# Patient Record
Sex: Male | Born: 1975 | Hispanic: Yes | Marital: Single
Health system: Southern US, Community
[De-identification: ages and names within clinical notes are randomized; demographics above are authoritative.]

---

## 2013-07-07 ENCOUNTER — Emergency Department: Payer: Self-pay | Admitting: Emergency Medicine

## 2013-09-23 ENCOUNTER — Emergency Department: Payer: Self-pay | Admitting: Emergency Medicine

## 2013-09-23 LAB — COMPREHENSIVE METABOLIC PANEL
ALBUMIN: 4.1 g/dL (ref 3.4–5.0)
ALT: 51 U/L (ref 12–78)
ANION GAP: 9 (ref 7–16)
Alkaline Phosphatase: 102 U/L
BUN: 16 mg/dL (ref 7–18)
Bilirubin,Total: 0.4 mg/dL (ref 0.2–1.0)
Calcium, Total: 8.5 mg/dL (ref 8.5–10.1)
Chloride: 104 mmol/L (ref 98–107)
Co2: 26 mmol/L (ref 21–32)
Creatinine: 1.04 mg/dL (ref 0.60–1.30)
EGFR (African American): >60
EGFR (Non-African Amer.): >60
Glucose: 124 mg/dL — ABNORMAL HIGH (ref 65–99)
Osmolality: 280 (ref 275–301)
Potassium: 3.5 mmol/L (ref 3.5–5.1)
SGOT(AST): 43 U/L — ABNORMAL HIGH (ref 15–37)
Sodium: 139 mmol/L (ref 136–145)
Total Protein: 8 g/dL (ref 6.4–8.2)

## 2013-09-23 LAB — CBC
HCT: 45.4 % (ref 40.0–52.0)
HGB: 15.4 g/dL (ref 13.0–18.0)
MCH: 31.3 pg (ref 26.0–34.0)
MCHC: 33.8 g/dL (ref 32.0–36.0)
MCV: 93 fL (ref 80–100)
PLATELETS: 228 10*3/uL (ref 150–440)
RBC: 4.9 10*6/uL (ref 4.40–5.90)
RDW: 14.4 % (ref 11.5–14.5)
WBC: 6.4 10*3/uL (ref 3.8–10.6)

## 2013-09-23 LAB — URINALYSIS, COMPLETE
BACTERIA: NONE SEEN
BLOOD: NEGATIVE
Bilirubin,UR: NEGATIVE
Glucose,UR: NEGATIVE mg/dL (ref 0–75)
Ketone: NEGATIVE
Leukocyte Esterase: NEGATIVE
Nitrite: NEGATIVE
PH: 6 (ref 4.5–8.0)
PROTEIN: NEGATIVE
RBC,UR: NONE SEEN /HPF (ref 0–5)
SPECIFIC GRAVITY: 1.017 (ref 1.003–1.030)
SQUAMOUS EPITHELIAL: NONE SEEN
WBC UR: 1 /HPF (ref 0–5)

## 2013-09-23 LAB — TROPONIN I: Troponin-I: <0.02 ng/mL

## 2013-09-28 ENCOUNTER — Emergency Department: Payer: Self-pay | Admitting: Emergency Medicine

## 2013-09-28 LAB — CBC
HCT: 46.5 % (ref 40.0–52.0)
HGB: 15.8 g/dL (ref 13.0–18.0)
MCH: 31.4 pg (ref 26.0–34.0)
MCHC: 34 g/dL (ref 32.0–36.0)
MCV: 92 fL (ref 80–100)
Platelet: 236 10*3/uL (ref 150–440)
RBC: 5.05 10*6/uL (ref 4.40–5.90)
RDW: 14 % (ref 11.5–14.5)
WBC: 7.1 10*3/uL (ref 3.8–10.6)

## 2013-09-28 LAB — COMPREHENSIVE METABOLIC PANEL
ALBUMIN: 4.2 g/dL (ref 3.4–5.0)
ALK PHOS: 97 U/L
ALT: 38 U/L (ref 12–78)
Anion Gap: 5 — ABNORMAL LOW (ref 7–16)
BUN: 19 mg/dL — AB (ref 7–18)
Bilirubin,Total: 0.6 mg/dL (ref 0.2–1.0)
CHLORIDE: 105 mmol/L (ref 98–107)
CREATININE: 1.44 mg/dL — AB (ref 0.60–1.30)
Calcium, Total: 8.8 mg/dL (ref 8.5–10.1)
Co2: 29 mmol/L (ref 21–32)
EGFR (African American): >60
EGFR (Non-African Amer.): >60
GLUCOSE: 84 mg/dL (ref 65–99)
Osmolality: 279 (ref 275–301)
POTASSIUM: 3.7 mmol/L (ref 3.5–5.1)
SGOT(AST): 48 U/L — ABNORMAL HIGH (ref 15–37)
SODIUM: 139 mmol/L (ref 136–145)
Total Protein: 7.8 g/dL (ref 6.4–8.2)

## 2013-09-28 LAB — PRO B NATRIURETIC PEPTIDE: B-TYPE NATIURETIC PEPTID: 13 pg/mL (ref 0–125)

## 2013-09-28 LAB — TROPONIN I: Troponin-I: <0.02 ng/mL

## 2013-10-01 LAB — BETA STREP CULTURE(ARMC)

## 2013-10-11 ENCOUNTER — Emergency Department: Payer: Self-pay | Admitting: Internal Medicine

## 2013-10-11 LAB — BASIC METABOLIC PANEL
Anion Gap: 3 — ABNORMAL LOW (ref 7–16)
BUN: 27 mg/dL — ABNORMAL HIGH (ref 7–18)
CHLORIDE: 105 mmol/L (ref 98–107)
CO2: 29 mmol/L (ref 21–32)
Calcium, Total: 8.6 mg/dL (ref 8.5–10.1)
Creatinine: 1.19 mg/dL (ref 0.60–1.30)
EGFR (African American): >60
EGFR (Non-African Amer.): >60
Glucose: 102 mg/dL — ABNORMAL HIGH (ref 65–99)
OSMOLALITY: 279 (ref 275–301)
Potassium: 3.3 mmol/L — ABNORMAL LOW (ref 3.5–5.1)
SODIUM: 137 mmol/L (ref 136–145)

## 2013-10-11 LAB — ACETAMINOPHEN LEVEL

## 2013-10-11 LAB — CBC
HCT: 44.5 % (ref 40.0–52.0)
HGB: 15.1 g/dL (ref 13.0–18.0)
MCH: 32 pg (ref 26.0–34.0)
MCHC: 34 g/dL (ref 32.0–36.0)
MCV: 94 fL (ref 80–100)
PLATELETS: 230 10*3/uL (ref 150–440)
RBC: 4.72 10*6/uL (ref 4.40–5.90)
RDW: 14.2 % (ref 11.5–14.5)
WBC: 10.9 10*3/uL — AB (ref 3.8–10.6)

## 2013-10-11 LAB — TROPONIN I

## 2013-10-11 LAB — DRUG SCREEN, URINE

## 2013-10-11 LAB — SALICYLATE LEVEL: Salicylates, Serum: <1.7 mg/dL

## 2013-10-11 LAB — ETHANOL
Ethanol %: <0.003 % (ref 0.000–0.080)
Ethanol: <3 mg/dL

## 2013-11-06 ENCOUNTER — Emergency Department: Payer: Self-pay | Admitting: Emergency Medicine

## 2013-11-06 LAB — CBC
HCT: 43.3 % (ref 40.0–52.0)
HGB: 14.7 g/dL (ref 13.0–18.0)
MCH: 31.9 pg (ref 26.0–34.0)
MCHC: 34.1 g/dL (ref 32.0–36.0)
MCV: 94 fL (ref 80–100)
Platelet: 258 10*3/uL (ref 150–440)
RBC: 4.62 10*6/uL (ref 4.40–5.90)
RDW: 14.6 % — ABNORMAL HIGH (ref 11.5–14.5)
WBC: 6.3 10*3/uL (ref 3.8–10.6)

## 2013-11-06 LAB — COMPREHENSIVE METABOLIC PANEL
ALBUMIN: 4.1 g/dL (ref 3.4–5.0)
ANION GAP: 9 (ref 7–16)
Alkaline Phosphatase: 80 U/L
BILIRUBIN TOTAL: 0.9 mg/dL (ref 0.2–1.0)
BUN: 27 mg/dL — ABNORMAL HIGH (ref 7–18)
CALCIUM: 8.8 mg/dL (ref 8.5–10.1)
CREATININE: 0.97 mg/dL (ref 0.60–1.30)
Chloride: 105 mmol/L (ref 98–107)
Co2: 26 mmol/L (ref 21–32)
Glucose: 108 mg/dL — ABNORMAL HIGH (ref 65–99)
Osmolality: 285 (ref 275–301)
Potassium: 3.6 mmol/L (ref 3.5–5.1)
SGOT(AST): 29 U/L (ref 15–37)
SGPT (ALT): 27 U/L (ref 12–78)
SODIUM: 140 mmol/L (ref 136–145)
Total Protein: 7.4 g/dL (ref 6.4–8.2)

## 2013-11-06 LAB — TROPONIN I: Troponin-I: <0.02 ng/mL

## 2013-11-06 LAB — LIPASE, BLOOD: Lipase: 172 U/L (ref 73–393)

## 2013-11-06 LAB — MAGNESIUM: MAGNESIUM: 2.2 mg/dL

## 2013-12-06 ENCOUNTER — Ambulatory Visit: Payer: Self-pay | Admitting: Gastroenterology

## 2013-12-14 ENCOUNTER — Emergency Department: Payer: Self-pay | Admitting: Emergency Medicine

## 2013-12-14 LAB — CBC
HCT: 44.6 % (ref 40.0–52.0)
HGB: 15.1 g/dL (ref 13.0–18.0)
MCH: 31.1 pg (ref 26.0–34.0)
MCHC: 33.8 g/dL (ref 32.0–36.0)
MCV: 92 fL (ref 80–100)
PLATELETS: 242 10*3/uL (ref 150–440)
RBC: 4.84 10*6/uL (ref 4.40–5.90)
RDW: 13.2 % (ref 11.5–14.5)
WBC: 6.3 10*3/uL (ref 3.8–10.6)

## 2013-12-14 LAB — BASIC METABOLIC PANEL
Anion Gap: 11 (ref 7–16)
BUN: 18 mg/dL (ref 7–18)
CHLORIDE: 106 mmol/L (ref 98–107)
CREATININE: 1 mg/dL (ref 0.60–1.30)
Calcium, Total: 8.6 mg/dL (ref 8.5–10.1)
Co2: 25 mmol/L (ref 21–32)
EGFR (Non-African Amer.): >60
GLUCOSE: 119 mg/dL — AB (ref 65–99)
Osmolality: 286 (ref 275–301)
Potassium: 3.1 mmol/L — ABNORMAL LOW (ref 3.5–5.1)
SODIUM: 142 mmol/L (ref 136–145)

## 2013-12-14 LAB — TROPONIN I: Troponin-I: <0.02 ng/mL

## 2013-12-17 ENCOUNTER — Emergency Department: Payer: Self-pay | Admitting: Emergency Medicine

## 2013-12-17 LAB — COMPREHENSIVE METABOLIC PANEL
ALBUMIN: 4 g/dL (ref 3.4–5.0)
ALT: 33 U/L
AST: 30 U/L (ref 15–37)
Alkaline Phosphatase: 89 U/L
Anion Gap: 8 (ref 7–16)
BILIRUBIN TOTAL: 1 mg/dL (ref 0.2–1.0)
BUN: 15 mg/dL (ref 7–18)
CALCIUM: 8.7 mg/dL (ref 8.5–10.1)
Chloride: 104 mmol/L (ref 98–107)
Co2: 29 mmol/L (ref 21–32)
Creatinine: 1.16 mg/dL (ref 0.60–1.30)
EGFR (African American): >60
Glucose: 114 mg/dL — ABNORMAL HIGH (ref 65–99)
Osmolality: 283 (ref 275–301)
Potassium: 3.3 mmol/L — ABNORMAL LOW (ref 3.5–5.1)
Sodium: 141 mmol/L (ref 136–145)
Total Protein: 7.6 g/dL (ref 6.4–8.2)

## 2013-12-17 LAB — DRUG SCREEN, URINE

## 2013-12-17 LAB — CBC
HCT: 45.1 % (ref 40.0–52.0)
HGB: 15.1 g/dL (ref 13.0–18.0)
MCH: 31.3 pg (ref 26.0–34.0)
MCHC: 33.6 g/dL (ref 32.0–36.0)
MCV: 93 fL (ref 80–100)
PLATELETS: 230 10*3/uL (ref 150–440)
RBC: 4.84 10*6/uL (ref 4.40–5.90)
RDW: 12.9 % (ref 11.5–14.5)
WBC: 5.6 10*3/uL (ref 3.8–10.6)

## 2013-12-17 LAB — URINALYSIS, COMPLETE
BLOOD: NEGATIVE
Bacteria: NONE SEEN
Bilirubin,UR: NEGATIVE
Glucose,UR: NEGATIVE mg/dL (ref 0–75)
KETONE: NEGATIVE
LEUKOCYTE ESTERASE: NEGATIVE
Nitrite: NEGATIVE
PH: 5 (ref 4.5–8.0)
Protein: NEGATIVE
RBC,UR: 1 /HPF (ref 0–5)
SPECIFIC GRAVITY: 1.005 (ref 1.003–1.030)
Squamous Epithelial: NONE SEEN
WBC UR: 1 /HPF (ref 0–5)

## 2013-12-17 LAB — TSH: THYROID STIMULATING HORM: 26.1 u[IU]/mL — AB

## 2013-12-17 LAB — SALICYLATE LEVEL: Salicylates, Serum: <1.7 mg/dL

## 2013-12-17 LAB — ETHANOL
Ethanol %: <0.003 % (ref 0.000–0.080)
Ethanol: <3 mg/dL

## 2013-12-17 LAB — ACETAMINOPHEN LEVEL: Acetaminophen: <2 ug/mL

## 2013-12-21 ENCOUNTER — Ambulatory Visit: Payer: Self-pay | Admitting: Cardiology

## 2014-09-07 NOTE — Consult Note (Signed)
PATIENT NAME:  Brandon Long, Brandon Long MR#:  [id number] DATE OF BIRTH:  28-Mar-1976  DATE OF CONSULTATION:  12/17/2013  CONSULTING PHYSICIAN:  Audery AmelJohn T. Clapacs, MD  IDENTIFYING INFORMATION AND REASON FOR CONSULTATION: A 39 year old man with a history of medical problems and a past history of some depressive symptoms comes to the Emergency Room complaining of recent onset of symptoms of depression. Consultation for appropriate disposition.   HISTORY OF PRESENT ILLNESS: Information obtained from the patient and the chart. The patient was interviewed with the help of a Spanish language interpreter from the hospital. He reports that he is chiefly concerned about the pain in his back which he says is causing him to have thoughts about killing himself. He does not actually think he is going to act on it, but says that he cannot get the thought out of his mind. He says he has pain in his right shoulder and in his lower back. It has been present for a couple of weeks. There is no clear new injury or cause for it. He also talks about having problems with his thyroid. He has been diagnosed as having hypothyroidism and is supposed to be taking thyroid supplement. He has been taking it now for a little over a week but knows it will take longer for it to readjust. He does report depressed mood, and low energy. Sleep is poor. Appetite is poor. Does not report any hallucinations or psychotic symptoms, is not currently receiving psychiatric treatment. The patient states that he does not think he is really going to act on trying to hurt himself and feels confident that if he goes home tonight he is not at risk of suicide. He is not currently abusing alcohol or drugs.   PAST PSYCHIATRIC HISTORY: He was seen in the Emergency Room here in May with a similar situation that seemed to have been provoked by treatment with prednisone. It is unclear whether his current symptoms could have been triggered by his treatment with his  thyroid hormone. He does not have a history of actually trying to kill himself in the past. No history of psychiatric hospitalization or medication.   FAMILY HISTORY: Says he has a sister and a mother both with hypothyroidism.   SOCIAL HISTORY: Lives by himself. Was last working about a couple of weeks ago, but had to stop because he was feeling more sick and dizzy. Does have family in the area that he stays in contact with.   PAST MEDICAL HISTORY: New onset back pain and recently diagnosed hypothyroidism.   MEDICATIONS: Thyroid hormone unclear dosage. Not known if he has any other medication.   ALLERGIES: PREDNISONE.   REVIEW OF SYSTEMS: Feeling somewhat depressed, having some suicidal thoughts without any intent to act on it. Denies hallucinations or delusions. Complains of pain in his shoulder and his lower back. Feeling fatigued, poor sleep, poor appetite.   MENTAL STATUS EXAMINATION: A slightly disheveled gentleman who looks older than his stated age. Cooperative with the interview. Eye contact normal. Psychomotor activity subdued. Speech understandable, somewhat decreased in tone. Affect blunted. Mood stated as being bad. Thoughts are apparently lucid with no evidence of loosening of associations or delusions. Denies hallucinations. Denies any homicidal ideation, has positive suicidal thoughts with no intent to act on it. Alert and oriented. Judgment and insight adequate.   VITAL SIGNS: Temperature 98.3, pulse 57, respirations 18, blood pressure 113/70.   LABORATORY RESULTS: His TSH is 26. Alcohol level negative. Chemistry panel: Elevated glucose 114, low potassium  at 3.3. Drug screen all negative. CBC unremarkable. Urinalysis unremarkable.   ASSESSMENT: A 39 year old man with a history of hypothyroidism. Also a past history of mood symptoms that seem to have been triggered by medication. Currently having symptoms of depression with some suicidal ideation, but has no plan or intent and he  has maintained good insight and judgment. The patient feels there is no risk that he is going to harm himself tonight. He is perfectly agreeable to following up with his primary care doctor and continuing his thyroid medicine. He has been educated to immediately contact someone or come back to the hospital if suicidal ideation were to worsen. He is agreeable to the plan. Given the situation, we will release him from [facility] and plan for followup with his primary care doctor.   DIAGNOSIS, PRINCIPAL AND PRIMARY:   AXIS I: Depression, not otherwise specified.   SECONDARY DIAGNOSES:  AXIS I: No further.   AXIS II: Deferred.   AXIS III: Back pain, unclear etiology, severe, hypothyroidism.   AXIS IV: Moderate from his illness and being out of work.   AXIS V: Functioning at time of evaluation 50.    ____________________________ Audery Amel, MD jtc:lt D: 12/17/2013 17:39:51 ET T: 12/17/2013 19:43:09 ET JOB#: [id number]  cc: Audery Amel, MD, <Dictator> Audery Amel MD ELECTRONICALLY SIGNED 12/26/2013 0:40

## 2014-09-07 NOTE — Consult Note (Signed)
PATIENT NAME:  Brandon Long, Brandon Long MR#:  [id number] DATE OF BIRTH:  02/02/1976  DATE OF CONSULTATION:  10/11/2013  REFERRING PHYSICIAN:   CONSULTING PHYSICIAN:  Shaylinn Hladik S. Giani Betzold, MD  REASON FOR CONSULTATION: I was feeling dizzy and could not feel my arms.  HISTORY OF PRESENT ILLNESS: The patient is a 39 year old Hispanic male who was evaluated with the help of the interpreter as he could not speak [place]. The patient reported that he came to the ER because he has been feeling dizzy and was having chest pain and shaking in his arms and legs. He reported that he has been having thoughts to hurt himself as well. He reported that the legs are not supporting him. He reported that he started the medication last week as he was having problems breathing and the medication was given by his lung doctor. He takes 2 pills in the morning and then 5 pills during the daytime. He stated that he started having weakness throughout since he has been on that medication. He was not advised to the taper dose of the medication. He reported that he started having depressive thoughts including hopeless, helpless and having thoughts to kill himself. He admits to having suicidal ideation with a plan to cut himself with a knife or something faster. He currently denied having any homicidal ideations. He stated that he currently lives by himself and works on Clinical biochemist of cars. He does not have any previous psychiatric history.   PAST PSYCHIATRIC HISTORY: The patient reported that he has never taken any psychotropic medication in the past. He is unable to contract for safety at this time.   FAMILY HISTORY: The patient reported that his sister has a history of mental illness and she takes medication in [place]. He does not know the names of the medication. She was never admitted to a psychiatric hospital.   SUBSTANCE ABUSE HISTORY: The patient reported that he does not drink or use drugs or alcohol. He does not smoke cigarettes.    SUBSTANCE ABUSE HISTORY: As above.   PAST MEDICAL HISTORY: The patient reported that he has history of asthma and was recently started on an inhaler as well as 2 other medications including Augmentin and prednisone by a doctor because he was unable to breathe. He reported that he has been compliant with his medications.   ALLERGIES: No known drug allergies.   CURRENT MEDICATIONS: Augmentin, prednisone 10 mg 5 pills daily, inhaler on a daily basis.   SOCIAL HISTORY: The patient reported that he is currently single and lives by himself. He works in an Associate Professor for [employer]. He reported that he does not have any pending legal charges. His family lives in [place].  REVIEW OF SYSTEMS:  CONSTITUTIONAL: Denies any fever or chills. No weight changes.  EYES: No double or blurred vision.  RESPIRATORY: Having some shortness of breath.  CARDIOVASCULAR: Denies any chest pain or orthopnea.  GASTROINTESTINAL: No abdominal pain, nausea, vomiting, diarrhea.  GENITOURINARY: No incontinence or frequency.  ENDOCRINE: No heat or cold intolerance.  LYMPHATIC: No anemia or easy bruising.  INTEGUMENTARY: No acne or rash.  MUSCULOSKELETAL: Having muscle pain AND weakness in his arms and legs.  NEUROLOGIC: No tingling or weakness.   VITAL SIGNS: Temperature 98.9, pulse 76, respiration 18, blood pressure 131/79.  LABORATORY DATA: Glucose 102, BUN 27, creatinine 1.19, sodium 137, potassium 3.3, chloride 105, bicarbonate 29, anion gap 3, osmolality 279. Blood alcohol level less than 3. UDS is negative. WBC 10.9, hemoglobin 15.1, hematocrit  44.5, platelet count 230,000, MCV 94, RDW 14.2.   MENTAL STATUS EXAMINATION: The patient is a moderately built male who was lying in the bed. He appeared somewhat in distress. Muscle tone appears normal. Gait and station was within normal limits. Speech was low in tone and volume. Thought process was logical, goal-directed. Thought content was nondelusional. No loose  associations are noted. His insight and judgment were within normal limits. He was awake, alert and oriented x3. His recent and remote memory were intact. Attention span and concentration were normal. Mood was depressed and affect was congruent. He admits to having suicidal ideation and wants to cut himself with a knife or something sharper. Unable to contract for safety at this time.   DIAGNOSTIC IMPRESSION: AXIS I: Major depressive disorder, recurrent, moderate.  AXIS II: None.  AXIS III: Asthma.   TREATMENT PLAN: 1.  The patient will be placed on involuntary commitment for safety and stabilization.  2.  He will continue on prednisone as he might be having an adverse reaction to the same. He will be monitored closely by the staff. No new medication will be started at this time.   Thank you for allowing me to participate in the care of this patient.   ____________________________ Ardeen FillersUzma S. Garnetta BuddyFaheem, MD usf:sb D: 10/11/2013 14:08:18 ET T: 10/11/2013 15:56:11 ET JOB#: [id number]  cc: Ardeen FillersUzma S. Garnetta BuddyFaheem, MD, <Dictator> Rhunette CroftUZMA S Alicja Everitt MD ELECTRONICALLY SIGNED 10/16/2013 16:00

## 2014-09-07 NOTE — Consult Note (Signed)
PATIENT NAME:  Brandon Long, Brandon Long MR#:  [id number] DATE OF BIRTH:  03/16/1976  DATE OF CONSULTATION:  10/12/2013  REFERRING PHYSICIAN:   CONSULTING PHYSICIAN:  Audery Amel, MD  IDENTIFYING INFORMATION AND REASON FOR CONSULTATION:  A 39 year old gentleman who came to the Emergency Room seeking help for some medical complaints and then revealed that he had been feeling depressed and having suicidal thoughts. The patient states that he had started having symptoms of depression about 3 days ago and it was an acute onset. He started to feel very negative about his life and feels like everything was terrible. He started to have suicidal thoughts. The thought had occurred to him that he would cut himself. He did not act on it. The patient had just within the last day or so started taking prednisone, which had been given to him apparently for an upper respiratory infection. The patient was sleeping poorly, feeling jittery, as well as depressed. He did not report any psychotic symptoms. The patient denies alcohol or drug abuse. He denies that he had had symptoms of major depression prior to that. Says that he would occasionally feel sad about some of the stresses in his life but generally felt pretty good.   PAST PSYCHIATRIC HISTORY: Denies any history of mental health problems. No history of depression. No hospitalization. No suicide attempts.   SUBSTANCE ABUSE HISTORY: Denies abuse of alcohol or drugs now or in the past.   SOCIAL HISTORY: The patient is a laborer. He has been living here in the [place] area for several years. He says that he lives alone, but has multiple family members who live nearby in the area.   PAST MEDICAL HISTORY: Had an upper respiratory infection recently, but denies that he has any kind of ongoing medical problems.   FAMILY HISTORY: Denies any family history of mental illness.   CURRENT MEDICATIONS: Steroids were stopped here in the Emergency Room. He has not been  given any other medication.   MENTAL STATUS EXAMINATION: Adequately groomed gentleman who looks his stated age, cooperative with the interview. The entire interview was conducted with a hospital Spanish language interpreter. Eye contact was good. Psychomotor activity normal. Speech was normal in rate, tone and volume. Affect was euthymic and appropriate. Mood was stated as being okay. Thoughts were lucid. No loosening of associations. No evidence of delusions. Denied hallucinations. Denied any suicidal or homicidal ideation. Stated that he felt pretty good about his life. Alert and oriented x 4. Judgment and insight improved. Basic fund of knowledge intact.   REVIEW OF SYSTEMS: Currently has no complaints. The sore throat and upper respiratory infection are better. He is not having any shortness of breath. Denies suicidal or homicidal ideation. Denies any psychotic symptoms.   LABORATORY RESULTS: Drug screen was all negative. EKG unremarkable. Acetaminophen and Tylenol and salicylates negative. Alcohol negative. Some minor abnormalities. Nonspecific on the chemistry panel.   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 Now resolved. Currently, no indication of acute dangerousness to himself. Also has no indication of any ongoing mental health problems. The patient no longer meets commitment criteria.   TREATMENT PLAN: The patient was educated about his reaction to steroids and that if in the future he is ever prescribed them, he should tell treatment staff about this episode. No indication to start any psychiatric medicine. He will be given information about local mental health agencies in case symptoms should return and he needed any further assistance.  DIAGNOSIS, PRINCIPAL AND PRIMARY:  AXIS I:  Substance-induced mood disorder, depressed, now resolved.   SECONDARY DIAGNOSES: AXIS I:    No diagnosis.  AXIS II:   No diagnosis.   AXIS III:  Upper respiratory infection, resolving.  AXIS IV:  Moderate from [place].  AXIS V:   Functioning at time of evaluation is 60.   ____________________________ Audery AmelJohn T. Clapacs, MD jtc:ce D: 10/12/2013 18:43:01 ET T: 10/12/2013 19:43:15 ET JOB#: [id number]  cc: Audery AmelJohn T. Clapacs, MD, <Dictator> Audery AmelJOHN T CLAPACS MD ELECTRONICALLY SIGNED 10/31/2013 13:18

## 2015-01-28 IMAGING — CR DG CHEST 2V
1 series · 2 of 2 positions shown · non-contrast
Comparison: 09/28/2013

CLINICAL DATA: Chest pressure, shortness of breath

EXAM:
CHEST  2 VIEW

[Series 1: w chest pa · 0.14mm/px · 2 of 2 slices shown]
[im 1/2]
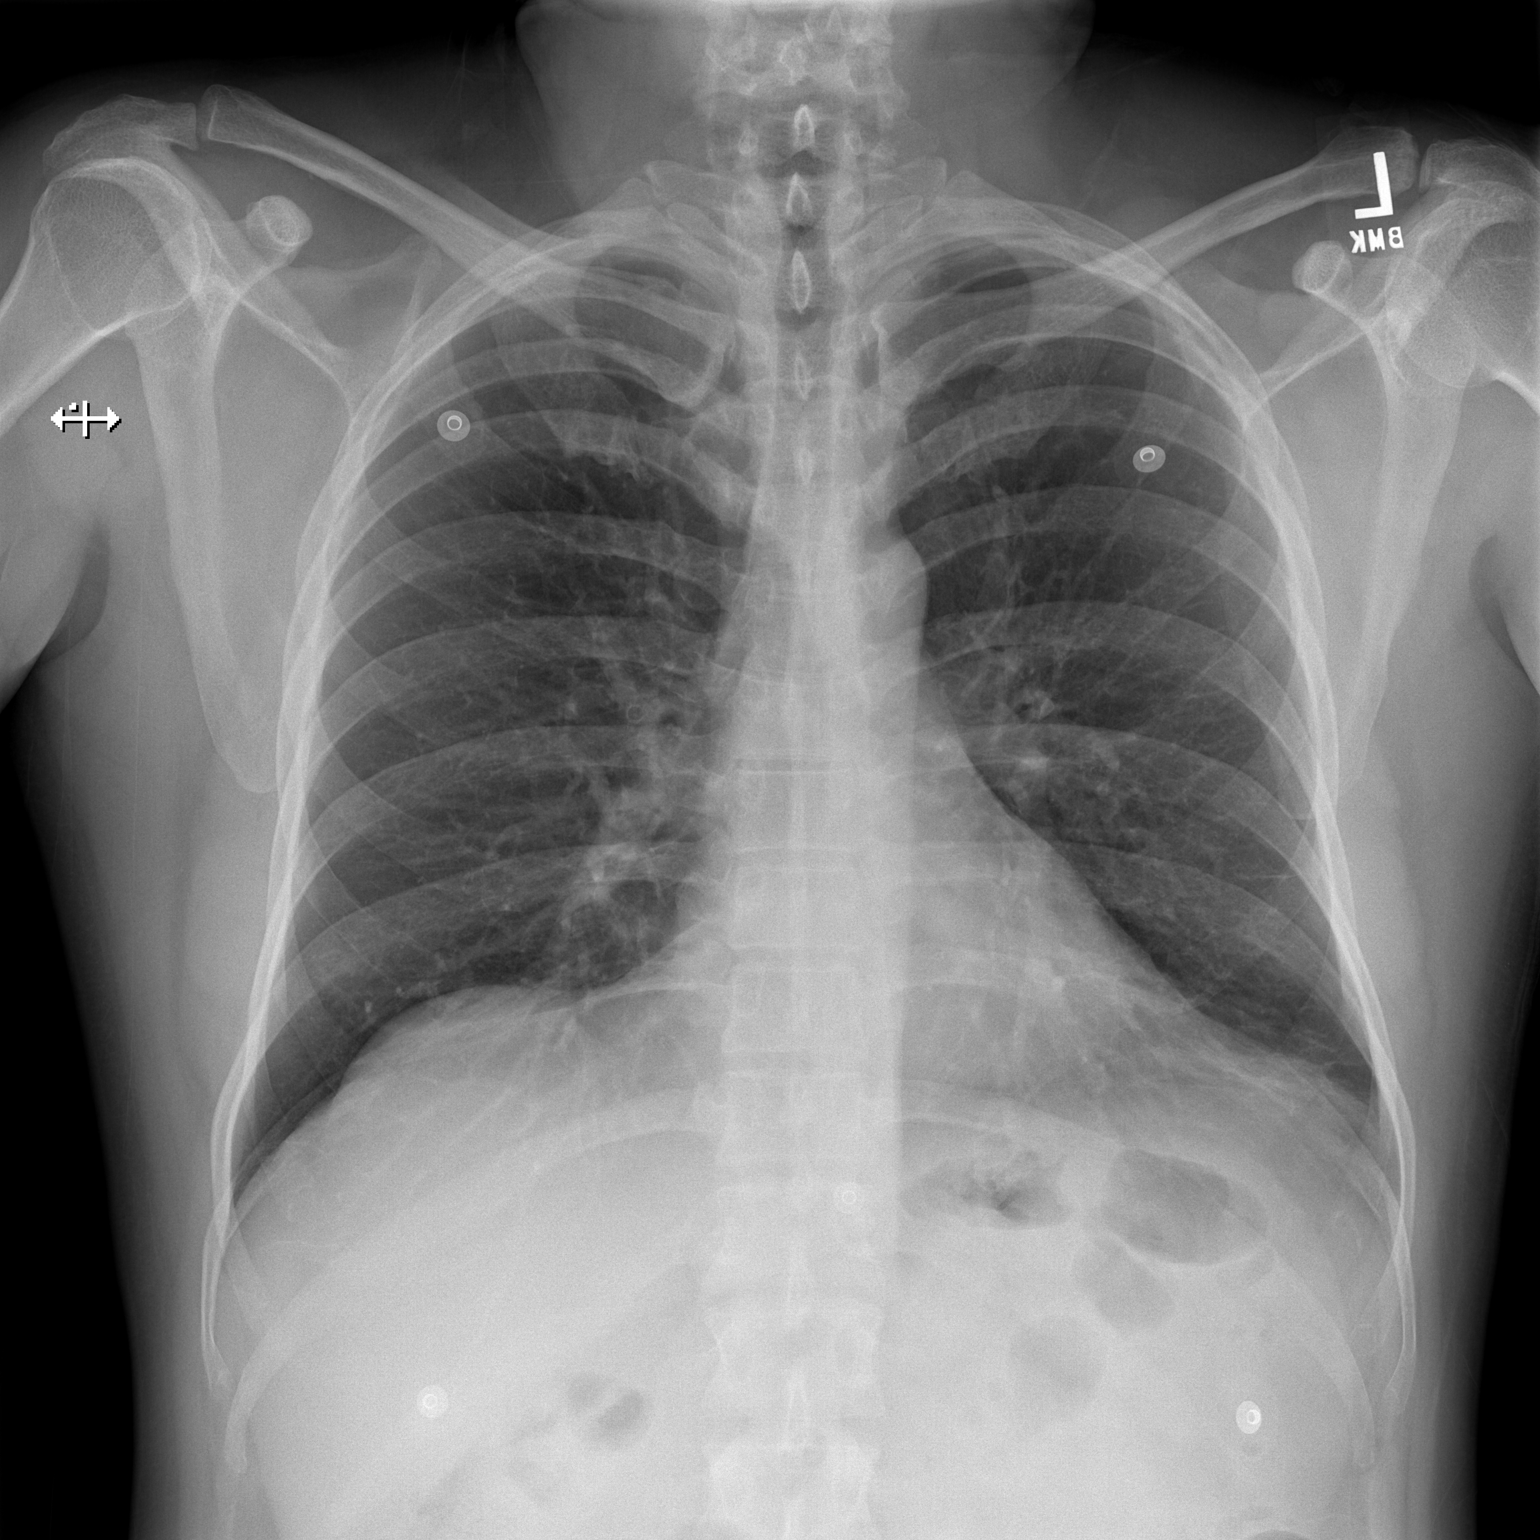
[im 2/2]
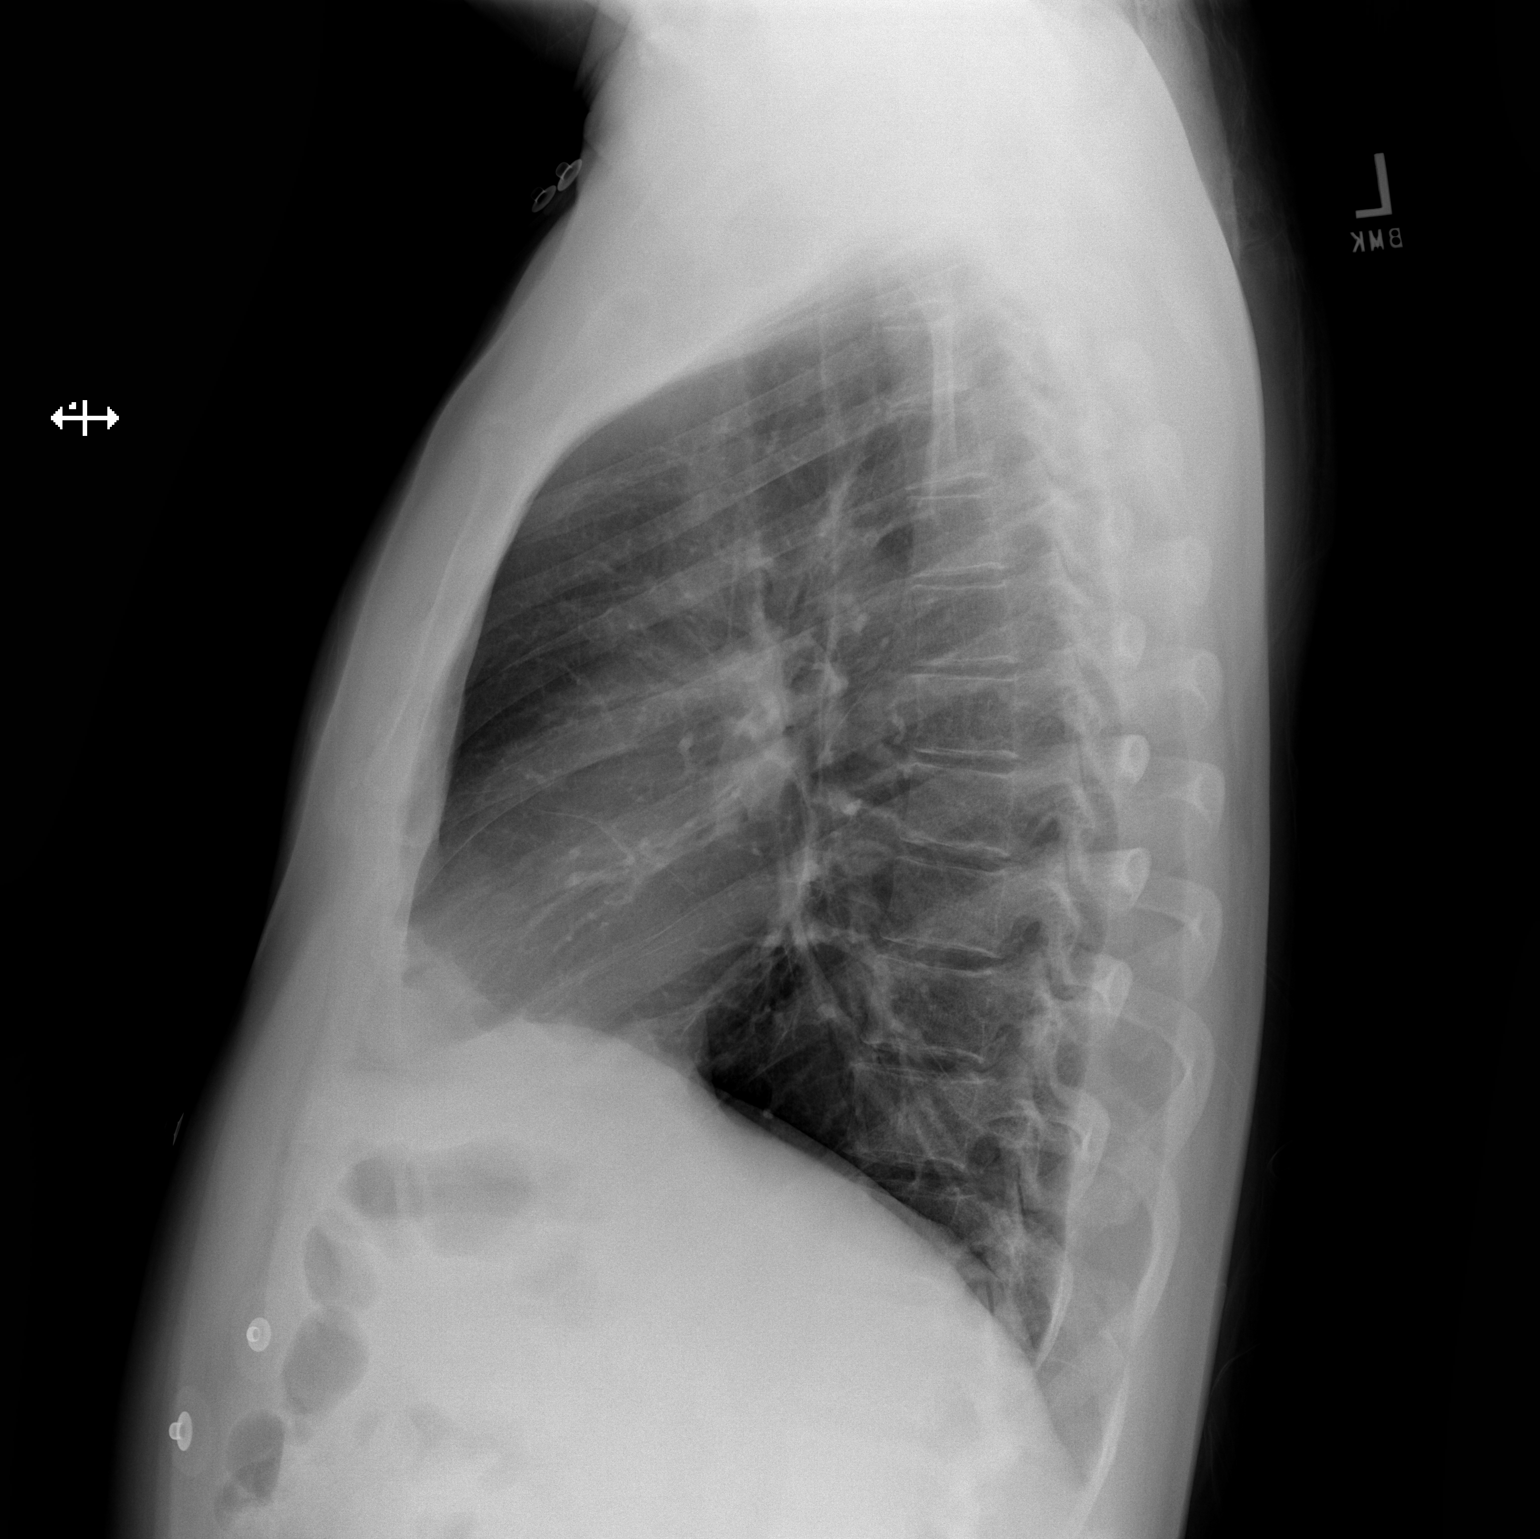

[2 of 2 positions shown; findings below may reference images not displayed]

FINDINGS: The heart size and mediastinal contours are within normal limits.
Both lungs are clear. The visualized skeletal structures are
unremarkable. Linear bilateral lower lobe scarring is reidentified.
IMPRESSION: No active cardiopulmonary disease.

## 2015-01-31 IMAGING — CR PELVIS - 1-2 VIEW
1 series · 1 of 1 positions shown · non-contrast
Comparison: None.

CLINICAL DATA: Right hip and sacral pain for 2 months, no reported
history of trauma

EXAM:
PELVIS - 1-2 VIEW

[t pelvis ap]
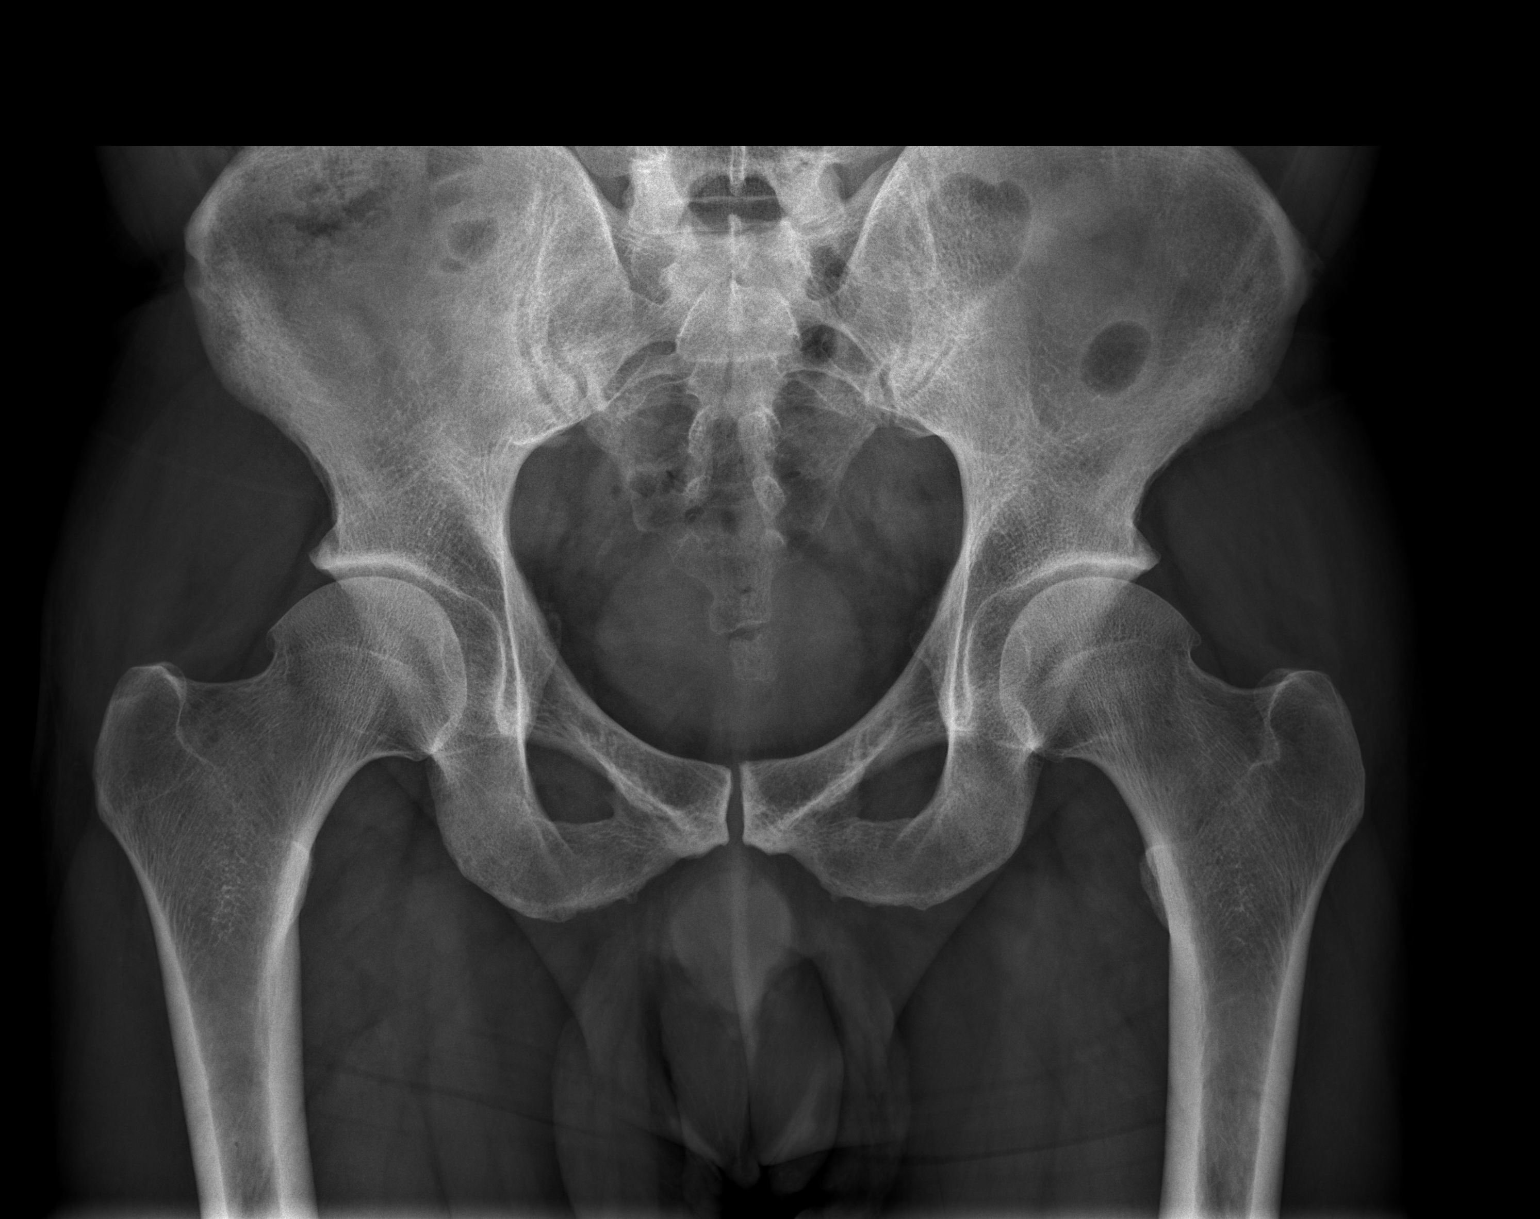

[1 of 1 positions shown; findings below may reference images not displayed]

FINDINGS: There is no evidence of pelvic fracture or diastasis. No other
pelvic bone lesions are seen.
IMPRESSION: Negative.
